# Patient Record
Sex: Male | Born: 1965 | Race: White | Hispanic: No | Marital: Single | Smoking: Current every day smoker
Health system: Southern US, Community
[De-identification: ages and names within clinical notes are randomized; demographics above are authoritative.]

## PROBLEM LIST (undated history)

## (undated) DIAGNOSIS — M1990 Unspecified osteoarthritis, unspecified site: Secondary | ICD-10-CM

## (undated) HISTORY — PX: FACIAL RECONSTRUCTION SURGERY: SHX631

## (undated) HISTORY — PX: EYE SURGERY: SHX253

---

## 2007-12-07 ENCOUNTER — Ambulatory Visit: Payer: Self-pay

## 2010-10-14 ENCOUNTER — Emergency Department: Payer: Self-pay | Admitting: Unknown Physician Specialty

## 2011-07-14 ENCOUNTER — Emergency Department: Payer: Self-pay | Admitting: Emergency Medicine

## 2011-07-17 ENCOUNTER — Emergency Department (HOSPITAL_COMMUNITY)
Admission: EM | Admit: 2011-07-17 | Discharge: 2011-07-17 | Disposition: A | Discharge status: Home / Self Care | Payer: No Typology Code available for payment source | Attending: Emergency Medicine | Admitting: Emergency Medicine

## 2011-07-17 ENCOUNTER — Emergency Department (HOSPITAL_COMMUNITY): Payer: No Typology Code available for payment source

## 2011-07-17 ENCOUNTER — Encounter: Payer: Self-pay | Admitting: *Deleted

## 2011-07-17 DIAGNOSIS — S39012A Strain of muscle, fascia and tendon of lower back, initial encounter: Secondary | ICD-10-CM

## 2011-07-17 DIAGNOSIS — S335XXA Sprain of ligaments of lumbar spine, initial encounter: Secondary | ICD-10-CM | POA: Insufficient documentation

## 2011-07-17 DIAGNOSIS — F17200 Nicotine dependence, unspecified, uncomplicated: Secondary | ICD-10-CM | POA: Insufficient documentation

## 2011-07-17 DIAGNOSIS — S139XXA Sprain of joints and ligaments of unspecified parts of neck, initial encounter: Secondary | ICD-10-CM | POA: Insufficient documentation

## 2011-07-17 DIAGNOSIS — Y92410 Unspecified street and highway as the place of occurrence of the external cause: Secondary | ICD-10-CM | POA: Insufficient documentation

## 2011-07-17 DIAGNOSIS — M129 Arthropathy, unspecified: Secondary | ICD-10-CM | POA: Insufficient documentation

## 2011-07-17 DIAGNOSIS — S161XXA Strain of muscle, fascia and tendon at neck level, initial encounter: Secondary | ICD-10-CM

## 2011-07-17 HISTORY — DX: Unspecified osteoarthritis, unspecified site: M19.90

## 2011-07-17 MED ORDER — ACETAMINOPHEN-CODEINE #3 300-30 MG PO TABS: 1.0000 | ORAL_TABLET | ORAL | Status: AC | PRN

## 2011-07-17 MED ORDER — DEXAMETHASONE 6 MG PO TABS: 6.0000 mg | ORAL_TABLET | Freq: Two times a day (BID) | ORAL | Status: AC
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
## 2011-07-17 NOTE — ED Notes (Signed)
Pt a/ox4. Resp even and unlabored. NAD at this time. D/C instructions reviewed with pt. Pt verbalized understanding. Pt ambulated to POV with steady gate. 

## 2011-07-17 NOTE — ED Provider Notes (Signed)
Medical screening examination/treatment/procedure(s) were performed by non-physician practitioner and as supervising physician I was immediately available for consultation/collaboration.   Burley Kopka M Keng Jewel, MD 07/17/11 2347 
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
## 2012-01-28 ENCOUNTER — Emergency Department: Payer: Self-pay | Admitting: Emergency Medicine

## 2012-09-14 ENCOUNTER — Emergency Department: Payer: Self-pay | Admitting: Emergency Medicine

## 2012-11-25 ENCOUNTER — Emergency Department: Payer: Self-pay | Admitting: Emergency Medicine

## 2012-11-25 LAB — DRUG SCREEN, URINE
Amphetamines, Ur Screen: NEGATIVE (ref ?–1000)
Barbiturates, Ur Screen: NEGATIVE (ref ?–200)
Cannabinoid 50 Ng, Ur ~~LOC~~: POSITIVE (ref ?–50)
Cocaine Metabolite,Ur ~~LOC~~: POSITIVE (ref ?–300)
MDMA (Ecstasy)Ur Screen: NEGATIVE (ref ?–500)

## 2014-03-02 ENCOUNTER — Emergency Department: Payer: Self-pay | Admitting: Emergency Medicine

## 2015-10-27 ENCOUNTER — Encounter: Payer: Self-pay | Admitting: Emergency Medicine

## 2015-10-27 ENCOUNTER — Emergency Department
Admission: EM | Admit: 2015-10-27 | Discharge: 2015-10-27 | Disposition: A | Discharge status: Home / Self Care | Payer: Self-pay | Attending: Emergency Medicine | Admitting: Emergency Medicine

## 2015-10-27 ENCOUNTER — Emergency Department: Payer: Self-pay

## 2015-10-27 DIAGNOSIS — S4991XA Unspecified injury of right shoulder and upper arm, initial encounter: Secondary | ICD-10-CM | POA: Insufficient documentation

## 2015-10-27 DIAGNOSIS — S4992XA Unspecified injury of left shoulder and upper arm, initial encounter: Secondary | ICD-10-CM | POA: Insufficient documentation

## 2015-10-27 DIAGNOSIS — Y92410 Unspecified street and highway as the place of occurrence of the external cause: Secondary | ICD-10-CM | POA: Insufficient documentation

## 2015-10-27 DIAGNOSIS — Y998 Other external cause status: Secondary | ICD-10-CM | POA: Insufficient documentation

## 2015-10-27 DIAGNOSIS — F17210 Nicotine dependence, cigarettes, uncomplicated: Secondary | ICD-10-CM | POA: Insufficient documentation

## 2015-10-27 DIAGNOSIS — Z7982 Long term (current) use of aspirin: Secondary | ICD-10-CM | POA: Insufficient documentation

## 2015-10-27 DIAGNOSIS — S39012A Strain of muscle, fascia and tendon of lower back, initial encounter: Secondary | ICD-10-CM | POA: Insufficient documentation

## 2015-10-27 DIAGNOSIS — Y9389 Activity, other specified: Secondary | ICD-10-CM | POA: Insufficient documentation

## 2015-10-27 DIAGNOSIS — S161XXA Strain of muscle, fascia and tendon at neck level, initial encounter: Secondary | ICD-10-CM | POA: Insufficient documentation

## 2015-10-27 MED ORDER — CYCLOBENZAPRINE HCL 10 MG PO TABS: 10.0000 mg | ORAL_TABLET | Freq: Three times a day (TID) | ORAL | Status: DC | PRN

## 2015-10-27 MED ORDER — IBUPROFEN 800 MG PO TABS: 800.0000 mg | ORAL_TABLET | Freq: Three times a day (TID) | ORAL | Status: DC | PRN

## 2015-10-27 NOTE — Discharge Instructions (Signed)
Cervical Sprain A cervical sprain is when the tissues (ligaments) that hold the neck bones in place stretch or tear. HOME CARE   Put ice on the injured area.  Put ice in a plastic bag.  Place a towel between your skin and the bag.  Leave the ice on for 15-20 minutes, 3-4 times a day.  You may have been given a collar to wear. This collar keeps your neck from moving while you heal.  Do not take the collar off unless told by your doctor.  If you have long hair, keep it outside of the collar.  Ask your doctor before changing the position of your collar. You may need to change its position over time to make it more comfortable.  If you are allowed to take off the collar for cleaning or bathing, follow your doctor's instructions on how to do it safely.  Keep your collar clean by wiping it with mild soap and water. Dry it completely. If the collar has removable pads, remove them every 1-2 days to hand wash them with soap and water. Allow them to air dry. They should be dry before you wear them in the collar.  Do not drive while wearing the collar.  Only take medicine as told by your doctor.  Keep all doctor visits as told.  Keep all physical therapy visits as told.  Adjust your work station so that you have good posture while you work.  Avoid positions and activities that make your problems worse.  Warm up and stretch before being active. GET HELP IF:  Your pain is not controlled with medicine.  You cannot take less pain medicine over time as planned.  Your activity level does not improve as expected. GET HELP RIGHT AWAY IF:   You are bleeding.  Your stomach is upset.  You have an allergic reaction to your medicine.  You develop new problems that you cannot explain.  You lose feeling (become numb) or you cannot move any part of your body (paralysis).  You have tingling or weakness in any part of your body.  Your symptoms get worse. Symptoms include:  Pain,  soreness, stiffness, puffiness (swelling), or a burning feeling in your neck.  Pain when your neck is touched.  Shoulder or upper back pain.  Limited ability to move your neck.  Headache.  Dizziness.  Your hands or arms feel week, lose feeling, or tingle.  Muscle spasms.  Difficulty swallowing or chewing. MAKE SURE YOU:   Understand these instructions.  Will watch your condition.  Will get help right away if you are not doing well or get worse.   This information is not intended to replace advice given to you by your health care provider. Make sure you discuss any questions you have with your health care provider.   Document Released: 04/15/2008 Document Revised: 06/30/2013 Document Reviewed: 05/05/2013 Elsevier Interactive Patient Education 2016 Elsevier Inc.  Lumbosacral Strain Lumbosacral strain is a strain of any of the parts that make up your lumbosacral vertebrae. Your lumbosacral vertebrae are the bones that make up the lower third of your backbone. Your lumbosacral vertebrae are held together by muscles and tough, fibrous tissue (ligaments).  CAUSES  A sudden blow to your back can cause lumbosacral strain. Also, anything that causes an excessive stretch of the muscles in the low back can cause this strain. This is typically seen when people exert themselves strenuously, fall, lift heavy objects, bend, or crouch repeatedly. RISK FACTORS  Physically demanding  work.  Participation in pushing or pulling sports or sports that require a sudden twist of the back (tennis, golf, baseball).  Weight lifting.  Excessive lower back curvature.  Forward-tilted pelvis.  Weak back or abdominal muscles or both.  Tight hamstrings. SIGNS AND SYMPTOMS  Lumbosacral strain may cause pain in the area of your injury or pain that moves (radiates) down your leg.  DIAGNOSIS Your health care provider can often diagnose lumbosacral strain through a physical exam. In some cases, you may  need tests such as X-ray exams.  TREATMENT  Treatment for your lower back injury depends on many factors that your clinician will have to evaluate. However, most treatment will include the use of anti-inflammatory medicines. HOME CARE INSTRUCTIONS   Avoid hard physical activities (tennis, racquetball, waterskiing) if you are not in proper physical condition for it. This may aggravate or create problems.  If you have a back problem, avoid sports requiring sudden body movements. Swimming and walking are generally safer activities.  Maintain good posture.  Maintain a healthy weight.  For acute conditions, you may put ice on the injured area.  Put ice in a plastic bag.  Place a towel between your skin and the bag.  Leave the ice on for 20 minutes, 2-3 times a day.  When the low back starts healing, stretching and strengthening exercises may be recommended. SEEK MEDICAL CARE IF:  Your back pain is getting worse.  You experience severe back pain not relieved with medicines. SEEK IMMEDIATE MEDICAL CARE IF:   You have numbness, tingling, weakness, or problems with the use of your arms or legs.  There is a change in bowel or bladder control.  You have increasing pain in any area of the body, including your belly (abdomen).  You notice shortness of breath, dizziness, or feel faint.  You feel sick to your stomach (nauseous), are throwing up (vomiting), or become sweaty.  You notice discoloration of your toes or legs, or your feet get very cold. MAKE SURE YOU:   Understand these instructions.  Will watch your condition.  Will get help right away if you are not doing well or get worse.   This information is not intended to replace advice given to you by your health care provider. Make sure you discuss any questions you have with your health care provider.   Document Released: 08/07/2005 Document Revised: 11/18/2014 Document Reviewed: 06/16/2013 Elsevier Interactive Patient  Education 2016 [employer].  Technical brewer It is common to have multiple bruises and sore muscles after a motor vehicle collision (MVC). These tend to feel worse for the first 24 hours. You may have the most stiffness and soreness over the first several hours. You may also feel worse when you wake up the first morning after your collision. After this point, you will usually begin to improve with each day. The speed of improvement often depends on the severity of the collision, the number of injuries, and the location and nature of these injuries. HOME CARE INSTRUCTIONS  Put ice on the injured area.  Put ice in a plastic bag.  Place a towel between your skin and the bag.  Leave the ice on for 15-20 minutes, 3-4 times a day, or as directed by your health care provider.  Drink enough fluids to keep your urine clear or pale yellow. Do not drink alcohol.  Take a warm shower or bath once or twice a day. This will increase blood flow to sore muscles.  You  may return to activities as directed by your caregiver. Be careful when lifting, as this may aggravate neck or back pain.  Only take over-the-counter or prescription medicines for pain, discomfort, or fever as directed by your caregiver. Do not use aspirin. This may increase bruising and bleeding. SEEK IMMEDIATE MEDICAL CARE IF:  You have numbness, tingling, or weakness in the arms or legs.  You develop severe headaches not relieved with medicine.  You have severe neck pain, especially tenderness in the middle of the back of your neck.  You have changes in bowel or bladder control.  There is increasing pain in any area of the body.  You have shortness of breath, light-headedness, dizziness, or fainting.  You have chest pain.  You feel sick to your stomach (nauseous), throw up (vomit), or sweat.  You have increasing abdominal discomfort.  There is blood in your urine, stool, or vomit.  You have pain in your shoulder  (shoulder strap areas).  You feel your symptoms are getting worse. MAKE SURE YOU:  Understand these instructions.  Will watch your condition.  Will get help right away if you are not doing well or get worse.   This information is not intended to replace advice given to you by your health care provider. Make sure you discuss any questions you have with your health care provider.   Document Released: 10/28/2005 Document Revised: 11/18/2014 Document Reviewed: 03/27/2011 Elsevier Interactive Patient Education [employer] Elsevier Inc.
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
## 2015-12-19 ENCOUNTER — Emergency Department
Admission: EM | Admit: 2015-12-19 | Discharge: 2015-12-19 | Disposition: A | Discharge status: Home / Self Care | Payer: Self-pay | Attending: Emergency Medicine | Admitting: Emergency Medicine

## 2015-12-19 DIAGNOSIS — K0889 Other specified disorders of teeth and supporting structures: Secondary | ICD-10-CM | POA: Insufficient documentation

## 2015-12-19 DIAGNOSIS — M545 Low back pain: Secondary | ICD-10-CM | POA: Insufficient documentation

## 2015-12-19 DIAGNOSIS — F17210 Nicotine dependence, cigarettes, uncomplicated: Secondary | ICD-10-CM | POA: Insufficient documentation

## 2015-12-19 DIAGNOSIS — K029 Dental caries, unspecified: Secondary | ICD-10-CM | POA: Insufficient documentation

## 2015-12-19 DIAGNOSIS — Z7982 Long term (current) use of aspirin: Secondary | ICD-10-CM | POA: Insufficient documentation

## 2015-12-19 MED ORDER — AMOXICILLIN 500 MG PO TABS: 500.0000 mg | ORAL_TABLET | Freq: Two times a day (BID) | ORAL | Status: AC

## 2015-12-19 MED ORDER — OXYCODONE-ACETAMINOPHEN 5-325 MG PO TABS
2.0000 | ORAL_TABLET | Freq: Once | ORAL | Status: AC
Start: 2015-12-19 — End: 2015-12-19
  Administered 2015-12-19: 2 via ORAL
  Filled 2015-12-19: qty 2

## 2015-12-19 MED ORDER — HYDROCODONE-ACETAMINOPHEN 5-325 MG PO TABS: 1.0000 | ORAL_TABLET | ORAL | Status: AC | PRN

## 2015-12-19 MED ORDER — HYDROCODONE-ACETAMINOPHEN 5-325 MG PO TABS
1.0000 | ORAL_TABLET | ORAL | Status: AC | PRN
Start: 2015-12-19 — End: ?

## 2015-12-19 MED ORDER — AMOXICILLIN 500 MG PO CAPS
500.0000 mg | ORAL_CAPSULE | Freq: Once | ORAL | Status: AC
  Administered 2015-12-19: 500 mg via ORAL
  Filled 2015-12-19: qty 1

## 2015-12-19 NOTE — Discharge Instructions (Signed)
OPTIONS FOR DENTAL FOLLOW UP CARE ° °[place] [facility] and [facility] Services - Local Safety Net Dental Clinics °http://www.ncdhhs.gov/dph/oralhealth/services/safetynetclinics.htm °  °[facility] (336-562-3123) ° °[facility] (919-933-9087) ° °[facility] (919-663-1744 ext [phone]) ° °[facility] ([phone]) ° °[facility] ([phone]) °This clinic caters to the indigent population and is on a lottery system. °Location: °[facility], [address], [place] °Clinic Hours: °Wednesdays from 6pm - 9pm, patients seen by a lottery system. °For dates, call or go to www.med.[facility].edu/[facility]/patients/Dental-[facility] °Services: °Cleanings, fillings and simple extractions. °Payment Options: °DENTAL WORK IS FREE OF CHARGE. Bring proof of income or support. °Best way to get seen: °Arrive at 5:15 pm - this is a lottery, NOT first come/first serve, so arriving earlier will not increase your chances of being seen. °  °  °[facility] °919-537-3737 °Select option 1 for emergencies °  °Location: °[facility], [address], [place] °Clinic Hours: °No walk-ins accepted - call the day before to schedule an appointment. °Check in times are 9:30 am and 1:30 pm. °Services: °Simple extractions, temporary fillings, pulpectomy/pulp debridement, uncomplicated abscess drainage. °Payment Options: °PAYMENT IS DUE AT THE TIME OF SERVICE.  Fee is usually $100-200, additional surgical procedures (e.g. abscess drainage) may be extra. °Cash, checks, Visa/MasterCard accepted.  Can file Medicaid if patient is covered for dental - patient should call case worker to check. °No discount for [facility] patients. °Best way to get seen: °MUST call the day before and get onto the schedule. Can usually be seen the next 1-2 days. No walk-ins accepted. °  °  °[facility] °[phone] °   °Location: °[facility], [address], [place] °Clinic Hours: °M, W, Th, F 8am or 1:30pm, Tues 9a or 1:30 - first come/first served. °Services: °Simple extractions, temporary fillings, uncomplicated abscess drainage.  You do not need to be an [place] resident. °Payment Options: °PAYMENT IS DUE AT THE TIME OF SERVICE. °Dental insurance, otherwise sliding scale - bring proof of income or support. °Depending on income and treatment needed, cost is usually $50-200. °Best way to get seen: °Arrive early as it is first come/first served. °  °  °[facility] °[phone] °  °Location: °[address] °Clinic Hours: °Mon-Thu 8a-5p °Services: °Most basic dental services including extractions and fillings. °Payment Options: °PAYMENT IS DUE AT THE TIME OF SERVICE. °Sliding scale, up to 50% off - bring proof if income or support. °Medicaid with dental option accepted. °Best way to get seen: °Call to schedule an appointment, can usually be seen within 2 weeks OR they will try to see walk-ins - show up at 8a or 2p (you may have to wait). °  °  °[facility] °[phone] °[place] RESIDENTS ONLY °  °Location: °[facility], [address], [place], [place] [address] °Clinic Hours: By appointment only. °Monday - Thursday 8am-5pm, Friday 8am-12pm °Services: Cleanings, fillings, extractions. °Payment Options: °PAYMENT IS DUE AT THE TIME OF SERVICE. °Cash, Visa or MasterCard. Sliding scale - $30 minimum per service. °Best way to get seen: °Come in to office, complete packet and make an appointment - need proof of income °or support monies for each household member and proof of [place] residence. °Usually takes about a month to get in. °  °  °[facility] °[phone] °  °Location: °[address]   [place] °Clinic Hours: Walk-in Urgent Care Dental Services are offered Monday-Friday  mornings only. °The numbers of emergencies accepted daily is limited to the number of °providers available. °Maximum 15 - Mondays, Wednesdays & Thursdays °Maximum 10 - Tuesdays & Fridays °Services: °You do not need to be a [place] resident to be seen for a dental emergency. °Emergencies are defined as pain, swelling, abnormal bleeding, or dental trauma. Walkins will receive x-rays if needed. °NOTE: Dental cleaning is not an emergency. °Payment Options: °PAYMENT IS DUE AT THE TIME OF SERVICE. °Minimum co-pay is $40.00 for uninsured patients. °Minimum co-pay is $3.00 for Medicaid with dental coverage. °Dental Insurance is accepted and must be presented at time of visit. °Medicare does not cover dental. °Forms of payment: Cash, credit card, checks. °Best way to get seen: °If not previously registered with the clinic, walk-in dental registration begins at 7:15 am and is on a first come/first serve basis. °If previously registered with the clinic, call to make an appointment. °  °  °[facility] °919-776-4359 °[place] RESIDENTS ONLY °  °Location: °[address], [place], [place] °Clinic Hours: °Mon-Thu 10a-2p °Services: Extractions only! °Payment Options: °FREE (donations accepted) - bring proof of income or support °Best way to get seen: °Call and schedule an appointment OR come at 8am on the 1st Monday of every month (except for holidays) when it is first come/first served. °  °  °[facility] °[phone] °  °Location: °[address], [place] °Clinic Hours: °Friday mornings °Services, Payment Options, Best way to get seen: °Call for info °
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
